# Patient Record
Sex: Male | Born: 1980 | Race: White | Hispanic: No | Smoking: Current every day smoker
Health system: Southern US, Community
[De-identification: ages and names within clinical notes are randomized; demographics above are authoritative.]

## PROBLEM LIST (undated history)

## (undated) DIAGNOSIS — F32A Depression, unspecified: Secondary | ICD-10-CM

## (undated) DIAGNOSIS — F329 Major depressive disorder, single episode, unspecified: Secondary | ICD-10-CM

## (undated) DIAGNOSIS — E785 Hyperlipidemia, unspecified: Secondary | ICD-10-CM

## (undated) DIAGNOSIS — G459 Transient cerebral ischemic attack, unspecified: Secondary | ICD-10-CM

## (undated) DIAGNOSIS — I10 Essential (primary) hypertension: Secondary | ICD-10-CM

## (undated) HISTORY — PX: APPENDECTOMY: SHX54

---

## 2010-02-14 ENCOUNTER — Inpatient Hospital Stay (HOSPITAL_COMMUNITY): Admission: EM | Admit: 2010-02-14 | Discharge: 2010-02-15 | Payer: Self-pay | Admitting: Emergency Medicine

## 2010-02-23 ENCOUNTER — Encounter
Admission: RE | Admit: 2010-02-23 | Discharge: 2010-04-13 | Payer: Self-pay | Source: Home / Self Care | Attending: Orthopedic Surgery | Admitting: Orthopedic Surgery

## 2010-03-04 ENCOUNTER — Ambulatory Visit (HOSPITAL_COMMUNITY): Admission: RE | Admit: 2010-03-04 | Discharge: 2010-03-04 | Payer: Self-pay | Admitting: Neurosurgery

## 2010-07-07 LAB — DIFFERENTIAL
Basophils Absolute: 0 10*3/uL (ref 0.0–0.1)
Eosinophils Absolute: 0 10*3/uL (ref 0.0–0.7)
Eosinophils Relative: 0 % (ref 0–5)
Monocytes Absolute: 1.5 10*3/uL — ABNORMAL HIGH (ref 0.1–1.0)

## 2010-07-07 LAB — RAPID URINE DRUG SCREEN, HOSP PERFORMED
Amphetamines: NOT DETECTED
Barbiturates: NOT DETECTED
Benzodiazepines: NOT DETECTED
Cocaine: NOT DETECTED

## 2010-07-07 LAB — BASIC METABOLIC PANEL
BUN: 6 mg/dL (ref 6–23)
BUN: 7 mg/dL (ref 6–23)
CO2: 25 mEq/L (ref 19–32)
CO2: 28 mEq/L (ref 19–32)
Chloride: 105 mEq/L (ref 96–112)
Chloride: 106 mEq/L (ref 96–112)
Creatinine, Ser: 0.96 mg/dL (ref 0.4–1.5)
Glucose, Bld: 90 mg/dL (ref 70–99)
Glucose, Bld: 93 mg/dL (ref 70–99)
Potassium: 4 mEq/L (ref 3.5–5.1)

## 2010-07-07 LAB — CBC
HCT: 39.8 % (ref 39.0–52.0)
MCH: 28 pg (ref 26.0–34.0)
MCH: 28.4 pg (ref 26.0–34.0)
MCHC: 32.9 g/dL (ref 30.0–36.0)
MCHC: 33 g/dL (ref 30.0–36.0)
MCV: 84.8 fL (ref 78.0–100.0)
MCV: 86.3 fL (ref 78.0–100.0)
Platelets: 229 10*3/uL (ref 150–400)
RDW: 13.8 % (ref 11.5–15.5)
RDW: 13.9 % (ref 11.5–15.5)

## 2010-07-07 LAB — ETHANOL: Alcohol, Ethyl (B): 128 mg/dL — ABNORMAL HIGH (ref 0–10)

## 2010-07-07 LAB — MRSA PCR SCREENING: MRSA by PCR: NEGATIVE

## 2010-07-07 LAB — TYPE AND SCREEN: ABO/RH(D): A POS
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
## 2011-09-06 ENCOUNTER — Encounter (HOSPITAL_COMMUNITY): Payer: Self-pay

## 2011-09-06 ENCOUNTER — Inpatient Hospital Stay (HOSPITAL_COMMUNITY)
Admission: AD | Admit: 2011-09-06 | Discharge: 2011-09-10 | DRG: 897 | Disposition: A | Discharge status: Home / Self Care | Payer: PRIVATE HEALTH INSURANCE | Source: Ambulatory Visit | Attending: Psychiatry | Admitting: Psychiatry

## 2011-09-06 ENCOUNTER — Emergency Department (HOSPITAL_COMMUNITY)
Admission: EM | Admit: 2011-09-06 | Discharge: 2011-09-06 | Disposition: A | Discharge status: Other Acute Inpatient Hospital | Payer: Self-pay | Attending: Emergency Medicine | Admitting: Emergency Medicine

## 2011-09-06 ENCOUNTER — Encounter (HOSPITAL_COMMUNITY): Payer: Self-pay | Admitting: Emergency Medicine

## 2011-09-06 DIAGNOSIS — F411 Generalized anxiety disorder: Secondary | ICD-10-CM | POA: Insufficient documentation

## 2011-09-06 DIAGNOSIS — F19939 Other psychoactive substance use, unspecified with withdrawal, unspecified: Secondary | ICD-10-CM | POA: Diagnosis present

## 2011-09-06 DIAGNOSIS — F1120 Opioid dependence, uncomplicated: Principal | ICD-10-CM | POA: Diagnosis present

## 2011-09-06 DIAGNOSIS — F1920 Other psychoactive substance dependence, uncomplicated: Secondary | ICD-10-CM

## 2011-09-06 DIAGNOSIS — F1994 Other psychoactive substance use, unspecified with psychoactive substance-induced mood disorder: Secondary | ICD-10-CM | POA: Diagnosis present

## 2011-09-06 DIAGNOSIS — F1910 Other psychoactive substance abuse, uncomplicated: Secondary | ICD-10-CM | POA: Insufficient documentation

## 2011-09-06 HISTORY — DX: Major depressive disorder, single episode, unspecified: F32.9

## 2011-09-06 HISTORY — DX: Depression, unspecified: F32.A

## 2011-09-06 LAB — CBC
MCH: 28.8 pg (ref 26.0–34.0)
MCHC: 33.7 g/dL (ref 30.0–36.0)
Platelets: 281 10*3/uL (ref 150–400)

## 2011-09-06 LAB — COMPREHENSIVE METABOLIC PANEL
ALT: 230 U/L — ABNORMAL HIGH (ref 0–53)
AST: 68 U/L — ABNORMAL HIGH (ref 0–37)
Calcium: 9.3 mg/dL (ref 8.4–10.5)
Creatinine, Ser: 1.24 mg/dL (ref 0.50–1.35)
GFR calc Af Amer: 89 mL/min — ABNORMAL LOW (ref >90)
Glucose, Bld: 82 mg/dL (ref 70–99)
Sodium: 134 mEq/L — ABNORMAL LOW (ref 135–145)
Total Protein: 7.3 g/dL (ref 6.0–8.3)

## 2011-09-06 LAB — RAPID URINE DRUG SCREEN, HOSP PERFORMED
Barbiturates: NOT DETECTED
Benzodiazepines: NOT DETECTED
Cocaine: NOT DETECTED
Opiates: POSITIVE — AB

## 2011-09-06 MED ORDER — CHLORDIAZEPOXIDE HCL 25 MG PO CAPS
25.0000 mg | ORAL_CAPSULE | Freq: Four times a day (QID) | ORAL | Status: AC | PRN
  Administered 2011-09-08: 25 mg via ORAL
  Filled 2011-09-06: qty 1

## 2011-09-06 MED ORDER — CLONIDINE HCL 0.1 MG PO TABS
0.1000 mg | ORAL_TABLET | Freq: Four times a day (QID) | ORAL | Status: AC
  Administered 2011-09-06 – 2011-09-08 (×10): 0.1 mg via ORAL
  Filled 2011-09-06 (×12): qty 1

## 2011-09-06 MED ORDER — ALUM & MAG HYDROXIDE-SIMETH 200-200-20 MG/5ML PO SUSP: 30.0000 mL | ORAL | Status: DC | PRN

## 2011-09-06 MED ORDER — MAGNESIUM HYDROXIDE 400 MG/5ML PO SUSP: 30.0000 mL | Freq: Every day | ORAL | Status: DC | PRN

## 2011-09-06 MED ORDER — ONDANSETRON HCL 4 MG PO TABS: 4.0000 mg | ORAL_TABLET | Freq: Three times a day (TID) | ORAL | Status: DC | PRN

## 2011-09-06 MED ORDER — ADULT MULTIVITAMIN W/MINERALS CH
1.0000 | ORAL_TABLET | Freq: Every day | ORAL | Status: DC
  Administered 2011-09-06 – 2011-09-10 (×5): 1 via ORAL
  Filled 2011-09-06 (×7): qty 1

## 2011-09-06 MED ORDER — DICYCLOMINE HCL 20 MG PO TABS
20.0000 mg | ORAL_TABLET | Freq: Four times a day (QID) | ORAL | Status: DC | PRN
  Administered 2011-09-06 – 2011-09-07 (×2): 20 mg via ORAL
  Filled 2011-09-06 (×2): qty 1

## 2011-09-06 MED ORDER — CHLORDIAZEPOXIDE HCL 25 MG PO CAPS: 25.0000 mg | ORAL_CAPSULE | Freq: Three times a day (TID) | ORAL | Status: DC

## 2011-09-06 MED ORDER — CHLORDIAZEPOXIDE HCL 25 MG PO CAPS: 25.0000 mg | ORAL_CAPSULE | ORAL | Status: DC

## 2011-09-06 MED ORDER — LOPERAMIDE HCL 2 MG PO CAPS
2.0000 mg | ORAL_CAPSULE | ORAL | Status: DC | PRN
  Administered 2011-09-07: 4 mg via ORAL

## 2011-09-06 MED ORDER — CLONIDINE HCL 0.1 MG PO TABS
0.1000 mg | ORAL_TABLET | Freq: Once | ORAL | Status: AC
  Administered 2011-09-06: 0.1 mg via ORAL
  Filled 2011-09-06: qty 1

## 2011-09-06 MED ORDER — IBUPROFEN 600 MG PO TABS
600.0000 mg | ORAL_TABLET | Freq: Three times a day (TID) | ORAL | Status: DC | PRN
  Administered 2011-09-06: 600 mg via ORAL
  Filled 2011-09-06: qty 1

## 2011-09-06 MED ORDER — HYDROXYZINE HCL 25 MG PO TABS
25.0000 mg | ORAL_TABLET | Freq: Four times a day (QID) | ORAL | Status: AC | PRN
  Administered 2011-09-07 – 2011-09-08 (×3): 25 mg via ORAL
  Filled 2011-09-06 (×3): qty 1

## 2011-09-06 MED ORDER — CLONIDINE HCL 0.1 MG PO TABS
0.1000 mg | ORAL_TABLET | Freq: Every day | ORAL | Status: DC
  Filled 2011-09-06 (×2): qty 1

## 2011-09-06 MED ORDER — ACETAMINOPHEN 325 MG PO TABS: 650.0000 mg | ORAL_TABLET | ORAL | Status: DC | PRN

## 2011-09-06 MED ORDER — METHOCARBAMOL 500 MG PO TABS
500.0000 mg | ORAL_TABLET | Freq: Three times a day (TID) | ORAL | Status: DC | PRN
  Administered 2011-09-06 – 2011-09-10 (×2): 500 mg via ORAL
  Filled 2011-09-06 (×2): qty 1

## 2011-09-06 MED ORDER — CHLORDIAZEPOXIDE HCL 25 MG PO CAPS
25.0000 mg | ORAL_CAPSULE | Freq: Four times a day (QID) | ORAL | Status: DC
  Administered 2011-09-06 – 2011-09-07 (×3): 25 mg via ORAL
  Filled 2011-09-06 (×3): qty 1

## 2011-09-06 MED ORDER — NICOTINE 21 MG/24HR TD PT24
21.0000 mg | MEDICATED_PATCH | Freq: Every day | TRANSDERMAL | Status: DC
  Administered 2011-09-06 – 2011-09-08 (×3): 21 mg via TRANSDERMAL
  Filled 2011-09-06 (×9): qty 1

## 2011-09-06 MED ORDER — ONDANSETRON 4 MG PO TBDP: 4.0000 mg | ORAL_TABLET | Freq: Four times a day (QID) | ORAL | Status: AC | PRN

## 2011-09-06 MED ORDER — THIAMINE HCL 100 MG/ML IJ SOLN: 100.0000 mg | Freq: Once | INTRAMUSCULAR | Status: DC

## 2011-09-06 MED ORDER — CHLORDIAZEPOXIDE HCL 25 MG PO CAPS: 25.0000 mg | ORAL_CAPSULE | Freq: Every day | ORAL | Status: DC

## 2011-09-06 MED ORDER — CLONIDINE HCL 0.1 MG PO TABS
0.1000 mg | ORAL_TABLET | ORAL | Status: AC
  Administered 2011-09-09 – 2011-09-10 (×4): 0.1 mg via ORAL
  Filled 2011-09-06 (×4): qty 1

## 2011-09-06 MED ORDER — NICOTINE 21 MG/24HR TD PT24
21.0000 mg | MEDICATED_PATCH | Freq: Once | TRANSDERMAL | Status: DC
  Administered 2011-09-06: 21 mg via TRANSDERMAL
  Filled 2011-09-06 (×2): qty 1

## 2011-09-06 MED ORDER — NICOTINE 21 MG/24HR TD PT24: 21.0000 mg | MEDICATED_PATCH | Freq: Every day | TRANSDERMAL | Status: DC

## 2011-09-06 MED ORDER — LORAZEPAM 1 MG PO TABS: 1.0000 mg | ORAL_TABLET | Freq: Three times a day (TID) | ORAL | Status: DC | PRN

## 2011-09-06 MED ORDER — TRAZODONE HCL 50 MG PO TABS
50.0000 mg | ORAL_TABLET | Freq: Every evening | ORAL | Status: DC | PRN
  Administered 2011-09-07 – 2011-09-09 (×3): 50 mg via ORAL
  Filled 2011-09-06 (×4): qty 1

## 2011-09-06 MED ORDER — NAPROXEN 500 MG PO TABS
500.0000 mg | ORAL_TABLET | Freq: Two times a day (BID) | ORAL | Status: DC | PRN
  Administered 2011-09-07 – 2011-09-09 (×5): 500 mg via ORAL
  Filled 2011-09-06 (×5): qty 1

## 2011-09-06 MED ORDER — VITAMIN B-1 100 MG PO TABS
100.0000 mg | ORAL_TABLET | Freq: Every day | ORAL | Status: DC
  Administered 2011-09-07 – 2011-09-10 (×4): 100 mg via ORAL
  Filled 2011-09-06 (×6): qty 1

## 2011-09-06 NOTE — BH Assessment (Signed)
Assessment Note   Maurice Edwards is a 31 y.o. male who presents to [facility] for detox.  Pt denies SI/HI/Psych.  Pt reports using: Heroin($100), Pain Pills(Percocet, Roxys- 5 pills), Alcohol(2 Beers), THC(5 Blunts), Cocaine(8 Ball, 3 Grams), all substances used daily.  Pt says--"I just got turned on to them, I want to stop for my 48 yr old son." "It's just not fun anymore".  Pt does have criminal charges--Shoplifting from [place], court date on 09/07/11.  Pt has no past hx of inpt SA treatment.  Pt c/o w/d sxs: body aches, leg/stomach cramps, tremors, skin crawling, HA, fatigue, sweats.  Pt attempted to obtain help from the methadone clinic but failed the test for benzos.    Axis I: Substance Abuse Axis II: Deferred Axis III:  Past Medical History  Diagnosis Date  . Depression    Axis IV: other psychosocial or environmental problems, problems related to legal system/crime, problems related to social environment and problems with primary support group Axis V: 51-60 moderate symptoms  Past Medical History:  Past Medical History  Diagnosis Date  . Depression     Past Surgical History  Procedure Date  . Appendectomy     Family History: No family history on file.  Social History:  reports that he has been smoking Cigarettes.  He has been smoking about 1 pack per day. He does not have any smokeless tobacco history on file. He reports that he drinks alcohol. He reports that he uses illicit drugs (IV, Cocaine, Marijuana, Opium, and Heroin).  Additional Social History:  Alcohol / Drug Use Pain Medications: None  Prescriptions: None  Over the Counter: None  History of alcohol / drug use?: Yes Longest period of sobriety (when/how long): None  Negative Consequences of Use: Financial;Personal relationships;Legal Withdrawal Symptoms: Cramps;Fever / Chills;Irritability;Sweats;Tremors;Tingling Substance #1 Name of Substance 1: Heroin  1 - Age of First Use: 29 YOM  1 - Amount (size/oz): $100 1 -  Frequency: Daily  1 - Duration: On-going  1 - Last Use / Amount: 09/05/11 Substance #2 Name of Substance 2: Pain Pills--Percocet, Roxys  2 - Age of First Use: 29 YOM  2 - Amount (size/oz): 5 Pills  2 - Frequency: Daily  2 - Duration: On-going  2 - Last Use / Amount: 09/05/11 Substance #3 Name of Substance 3: Alcohol  3 - Age of First Use: Teens  3 - Amount (size/oz): 2 Beers  3 - Frequency: Daily  3 - Duration: On-going  3 - Last Use / Amount: 09/05/11 Substance #4 Name of Substance 4: THC  4 - Age of First Use: Teens  4 - Amount (size/oz): 5 Blunts  4 - Frequency: Daily  4 - Duration: On-going  4 - Last Use / Amount: 09/05/11 Substance #5 Name of Substance 5: Cocaine  5 - Age of First Use: 29 YOM  5 - Amount (size/oz): "8 Ball", 3 Grams  5 - Frequency: Daily  5 - Duration: On-going  5 - Last Use / Amount: 09/05/11 Allergies:  Allergies  Allergen Reactions  . Penicillins     "childhood"    Home Medications:  (Not in a hospital admission)  OB/GYN Status:  No LMP for male patient.  General Assessment Data Location of Assessment: [facility] Living Arrangements: Other relatives Can pt return to current living arrangement?: Yes Admission Status: Voluntary Is patient capable of signing voluntary admission?: Yes Transfer from: Acute Hospital Referral Source: MD  Education Status Is patient currently in school?: No Current Grade: None  Highest grade of school patient has completed: Unk  Name of school: [employer]  Contact person: None   Risk to self Suicidal Ideation: No Suicidal Intent: No Is patient at risk for suicide?: No Suicidal Plan?: No Access to Means: No What has been your use of drugs/alcohol within the last 12 months?: Abusing: Heroin, Pain Pills, Alcohol, THC, Cocaine  Previous Attempts/Gestures: No How many times?: 0  Other Self Harm Risks: None  Triggers for Past Attempts: None known Intentional Self Injurious Behavior: None Family Suicide History:  No Recent stressful life event(s): Other (Comment) (Chronic SA) Persecutory voices/beliefs?: No Depression: Yes Depression Symptoms: Loss of interest in usual pleasures;Guilt Substance abuse history and/or treatment for substance abuse?: Yes Suicide prevention information given to non-admitted patients: Not applicable  Risk to Others Homicidal Ideation: No Thoughts of Harm to Others: No Current Homicidal Intent: No Current Homicidal Plan: No Access to Homicidal Means: No Identified Victim: None  History of harm to others?: No Assessment of Violence: None Noted Violent Behavior Description: None Does patient have access to weapons?: No Criminal Charges Pending?: Yes Describe Pending Criminal Charges: Shoplifting([employer])  Does patient have a court date: Yes Court Date: 09/07/11  Psychosis Hallucinations: None noted Delusions: None noted  Mental Status Report Appear/Hygiene: Disheveled;Poor hygiene Eye Contact: Fair Motor Activity: Unremarkable Speech: Logical/coherent Level of Consciousness: Alert Mood: Sad Affect: Sad Anxiety Level: None Thought Processes: Coherent;Relevant Judgement: Unimpaired Orientation: Person;Place;Time;Situation Obsessive Compulsive Thoughts/Behaviors: None  Cognitive Functioning Concentration: Normal Memory: Recent Intact;Remote Intact IQ: Average Insight: Fair Impulse Control: Fair Appetite: Fair Weight Loss: 0  Weight Gain: 0  Sleep: No Change Total Hours of Sleep: 6  Vegetative Symptoms: None  Prior Inpatient Therapy Prior Inpatient Therapy: No Prior Therapy Dates: None  Prior Therapy Facilty/Provider(s): None  Reason for Treatment: None   Prior Outpatient Therapy Prior Outpatient Therapy: No Prior Therapy Dates: None  Prior Therapy Facilty/Provider(s): None  Reason for Treatment: None   ADL Screening (condition at time of admission) Patient's cognitive ability adequate to safely complete daily activities?: Yes Patient  able to express need for assistance with ADLs?: Yes Independently performs ADLs?: Yes Weakness of Legs: None Weakness of Arms/Hands: None       Abuse/Neglect Assessment (Assessment to be complete while patient is alone) Physical Abuse: Denies Verbal Abuse: Denies Sexual Abuse: Denies Exploitation of patient/patient's resources: Denies Self-Neglect: Denies Values / Beliefs Cultural Requests During Hospitalization: None Spiritual Requests During Hospitalization: None Consults Spiritual Care Consult Needed: No Social Work Consult Needed: No Merchant navy officer (For Healthcare) Advance Directive: Patient does not have advance directive;Patient would not like information Pre-existing out of facility DNR order (yellow form or pink MOST form): No    Additional Information 1:1 In Past 12 Months?: No CIRT Risk: No Elopement Risk: No Does patient have medical clearance?: Yes     Disposition:  Disposition Disposition of Patient: Referred to Patient referred to: [facility]  On Site Evaluation by:   Reviewed with Physician:     Murrell Redden 09/06/2011 5:40 AM

## 2011-09-06 NOTE — H&P (Signed)
  Psychiatric Admission Assessment Adult  Patient Identification:  Maurice Edwards Date of Evaluation:  09/06/2011 31yo SWM CC: I need a detox opiates THC -Has court tomorrow 5/15 shoplifting from [place]  History of Present Illness: Says he wants to get clean for 5yo son. Says he just got turned on to drugs 2 years ago but now it's just not fun anymore. Has been using daily anything he can get. Heroin $100 Pain pills Beer THC and cocaine.Tried to go to a methadone clinic 2 weeks ago but was for benzoes and hence could not start. Tonight is already saying that he probably just needs to get on methadone.Hebuys his drugs using his unemployment check and selling to other users.   Past Psychiatric History: Denies   Substance Abuse History:  Social History:    reports that he has been smoking Cigarettes.  He has been smoking about 1 pack per day. He does not have any smokeless tobacco history on file. He reports that he drinks alcohol. He reports that he uses illicit drugs (IV, Cocaine, Marijuana, Opium, and Heroin). Started with alcohol and THC as a teen. Age 41 cocaine heroin & opiates HS class of 2000 never married has a 12 yo son denies probation but has had many prior charges.  Family Psych History: Mother abuses prescription pain drugs Father is an alcoholic   Past Medical History:     Past Medical History  Diagnosis Date  . Depression   Denies depression      Past Surgical History  Procedure Date  . Appendectomy   age 19 or 9   Allergies:  Allergies  Allergen Reactions  . Penicillins     "childhood"    Current Medications:  Prior to Admission medications   Not on File    Mental Status Examination/Evaluation: Objective:  Appearance: Fairly Groomed many large tattoos   Psychomotor Activity:  Normal  Eye Contact::  Good  Speech:  Normal Rate  Volume:  Normal  Mood: all right    Affect:  Appropriate  Thought Process: clear rational goal oriented  -go to methadone clinic    Orientation:  Full  Thought Content:  No AVH/psychosis   Suicidal Thoughts:  No  Homicidal Thoughts:  No  Judgement:  Fair  Insight:  Fair    DIAGNOSIS:    AXIS I Substance Abuse  AXIS II Deferred  AXIS III See medical history.  AXIS IV economic problems, educational problems, occupational problems, problems related to legal system/crime and problems with primary support group  AXIS V 51-60 moderate symptoms withdrawal      Treatment Plan Summary: Admit for safety & stabilization  Medically support through detox using Clonidine and Librium protocols. Help identify further SA treatment

## 2011-09-06 NOTE — Discharge Planning (Signed)
Patient has been accepted to [facility] by [facility] bed 305-2. Patient's support paperwork has been completed. EDP notified and is in agreement with disposition. EDP will discharge pt to [facility]. Pt nurse notified as well. ALL appropriate paperwork completed and forwarded to [facility] for review.   Manson Passey Zitlali Primm ANN S , MSW, LCSWA 09/06/2011 1:16 PM [phone]

## 2011-09-06 NOTE — ED Provider Notes (Signed)
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 The patient states that he uses daily, he uses by intravenous route and we cannot get heroin he will snort or ingest oral opiate medications. He states he has a 8-year-old child and wants to change so that he can give the child a future. Currently he feels like he is having some withdrawal symptoms with some abdominal cramping and nausea but has not had diaphoresis diarrhea or vomiting.  Physical exam:  Abdomen is soft, nontender, increased bowel sounds, skin is dry, mucous membranes are moist, pupils are 4 mm and reactive bilaterally, heart and lungs are clear without tachycardia murmurs wheezing rales or rhonchi. Peripheral extremities show no signs of edema, no signs of rash, no signs of subcutaneous infections as a sequelae of intravenous drug use.  Assessment:  Behavioral assessment team contacted to help with placement, patient appears hemodynamically stable, medications for opiate withdrawal ordered, he does not have any tremor, hallucinations, seizures or history of DTs and does not appear to be alcohol or benzodiazepine withdrawal at this time.  Medical screening examination/treatment/procedure(s) were conducted as a shared visit with non-physician practitioner(s) and myself.  I personally evaluated the patient during the encounter    Vida Roller, MD 09/06/11 [phone]

## 2011-09-06 NOTE — ED Notes (Signed)
Patient belongings now behind nurses station at room 25. Patients family at bedside. Mother taking patients wallet home.

## 2011-09-06 NOTE — ED Notes (Signed)
Belongings placed in [place] [address]

## 2011-09-06 NOTE — Progress Notes (Signed)
Patient came to the window for her 1700 medications. He asked if he had order for Methadone or Suboxon. His mood and affects appropriate and he denied SI/HI, denied Hallucinations, endorsed having chills and sweaty. Writer told patient that clonidine protocol is used on the unit for opiate withdrawals. Q 15 minute check continues to maintain safety.

## 2011-09-06 NOTE — ED Notes (Signed)
Pt. and belongings wanded by security 

## 2011-09-06 NOTE — ED Notes (Signed)
Mother has taken patients wallet home with patients approval. 2 bags of belongings remain behind nurses station at room 25 on acute side of ED. Mother would like for patient to call home when he gets discharged, transported or sent home.

## 2011-09-06 NOTE — Discharge Planning (Signed)
CSW contacted [facility]. Advised by Brett Canales there are no beds.  Called [facility] and Elnita Maxwell advised there are no beds either.  Patient to be run at [facility].  CSW spoke with patient about his court date tomorrow. Patient reports that his lawyer is aware of his pending admission for detox and will communicate that on his court date.  Manson Passey Latajah Thuman ANN S , MSW, LCSWA 09/06/2011 9:35 AM [phone]

## 2011-09-06 NOTE — ED Notes (Signed)
Pt alert,nad,arrives from home, wanting detox from drugs and alcohol , last used this evening, resp even unlabored, skin pwd

## 2011-09-06 NOTE — ED Notes (Addendum)
Pt belongings behind nurses station; bag one contains shoes, bag two contains pants, shirt, hat, cell phone, and wallet.

## 2011-09-06 NOTE — ED Notes (Signed)
Report received-airway intact-no s/s's of distress-resting quietly

## 2011-09-06 NOTE — ED Notes (Signed)
Pt sts he is trying to detox from heroin. In no pain at this time, sts he just feels sick.

## 2011-09-06 NOTE — ED Notes (Addendum)
Pt discharged to [facility]. Taken via IT trainer. Two bags belongings sent with pt. Report given to [facility] RN. [facility]SS.

## 2011-09-06 NOTE — Progress Notes (Signed)
Patient ID: Maurice Edwards, male   DOB: 1981/04/23, 32 y.o.   MRN: [id number] Pt denies SI/HI/AVH. Pt denies any history of physical, verbal, or sexual abuse. Pt denies any depression. Pt admitted voluntarily for drug and ETOH detox. Pt uses cocaine, heroin, THC, and opiates. Pt states that he also drinks 3-4 times a week. Pt consumes both beer and liquor. Pt states that he "drinks like a fish."  Pt began smoking THC at the age of 20. States that he use to take [facility] from his mother. Pt has a 25 year old son that is his motivation to detox. PT states that he wants to be a role model for him. Pt is currently homeless.

## 2011-09-06 NOTE — ED Notes (Signed)
Resting comfortably-no s/s's of withdrawal-no complaints at this time-will continue to monitor

## 2011-09-06 NOTE — ED Notes (Signed)
Pt admitted to psych ED requesting detox from opiates. States he's been injecting heroin daily for past year. Uses approx. $100 each day. States began using because pills became too expensive. States he has never had rehab before but wants to get clean for his 30 year old son and to have a life with him. States uses MJ daily and ETOH about three days/week. Denies SI/HI or A/VH. Pt is pleasant and cooperative. Unit policies reviewed. Verbalized understanding. Will cont. To monitor.

## 2011-09-06 NOTE — Discharge Planning (Signed)
[facility] called to followup on call placed by PM ACT team member advising they are awaiting information on patient.  CSW faxed referral form to [facility] and confirmed recpt.  Pending disposition.  Manson Passey Rowyn Spilde ANN S , MSW, LCSWA 09/06/2011 12:29 PM [phone]

## 2011-09-06 NOTE — Tx Team (Signed)
Initial Interdisciplinary Treatment Plan  PATIENT STRENGTHS: (choose at least two) Ability for insight Active sense of humor Average or above average intelligence Motivation for treatment/growth  PATIENT STRESSORS: Substance abuse   PROBLEM LIST: Problem List/Patient Goals Date to be addressed Date deferred Reason deferred Estimated date of resolution  Substance Abuse                                                       DISCHARGE CRITERIA:  Motivation to continue treatment in a less acute level of care  PRELIMINARY DISCHARGE PLAN: Outpatient therapy  PATIENT/FAMIILY INVOLVEMENT: This treatment plan has been presented to and reviewed with the patient, [name], and/or family member.  The patient and family have been given the opportunity to ask questions and make suggestions.  Gretta Arab [facility] 09/06/2011, 3:28 PM

## 2011-09-06 NOTE — ED Provider Notes (Signed)
History     CSN: [id number]  Arrival date & time 09/06/11  0056   First MD Initiated Contact with Patient 09/06/11 0211      Chief Complaint  Patient presents with  . Medical Clearance  . Detox     (Consider location/radiation/quality/duration/timing/severity/associated sxs/prior treatment) HPI Comments: Patient has been injecting heroin for the past year and a half days.  He started once detox from it.  He is also using pills.  It is buying off the street including benzos has tried going to the methadone clinic twice in the last 2 weeks, but has failed a drug test because he said benzos in his system.  Is not suicidal or homicidal  The history is provided by the patient.    History reviewed. No pertinent past medical history.  Past Surgical History  Procedure Date  . Appendectomy     No family history on file.  History  Substance Use Topics  . Smoking status: Current Everyday Smoker -- 1.0 packs/day    Types: Cigarettes  . Smokeless tobacco: Not on file  . Alcohol Use: Yes      Review of Systems  Constitutional: Negative for fever.  HENT: Negative for rhinorrhea.   Gastrointestinal: Negative for abdominal pain.  Musculoskeletal: Negative for myalgias.  Psychiatric/Behavioral: Negative for suicidal ideas. The patient is nervous/anxious.     Allergies  Penicillins  Home Medications  No current outpatient prescriptions on file.  BP 120/65  Pulse 68  Temp 98 F (36.7 C)  Resp 16  Wt 160 lb (72.576 kg)  SpO2 99%  Physical Exam  Constitutional: He is oriented to person, place, and time. He appears well-developed and well-nourished.  HENT:  Head: Normocephalic.  Eyes: Pupils are equal, round, and reactive to light.  Neck: Normal range of motion.  Cardiovascular: Normal rate.   Pulmonary/Chest: Effort normal.  Musculoskeletal: Normal range of motion.  Neurological: He is alert and oriented to person, place, and time.  Skin: Skin is warm.  Psychiatric:  His behavior is normal. His mood appears anxious. His speech is not rapid and/or pressured. Cognition and memory are normal. He does not express impulsivity. He expresses no homicidal and no suicidal ideation.    ED Course  Procedures (including critical care time)   Labs Reviewed  CBC  COMPREHENSIVE METABOLIC PANEL  ETHANOL  URINE RAPID DRUG SCREEN (HOSP PERFORMED)   No results found.   No diagnosis found.    MDM  Resting detox from heroin.  At this time is feeling, just anterior like he wants to get high.  Denies abdominal pain, nausea, tachycardia.  We'll treat with clonidine        Arman Filter, NP 09/06/11 0234

## 2011-09-07 DIAGNOSIS — F1994 Other psychoactive substance use, unspecified with psychoactive substance-induced mood disorder: Secondary | ICD-10-CM

## 2011-09-07 NOTE — Progress Notes (Signed)
Pt attended discharge planning group and actively participated.  Pt presents with calm mood and affect.  Pt was open with sharing reason for entering the hospital.  Pt states that he has been using heroin and pain pills for the past 2 years.  Pt states he brought himself here to get help and to detox.  Pt states that he hasn't received help to get off of drugs before.  Pt states that he is homeless, living back and forth between [facility] and [place].  Pt states that he wants to get clean for his 74 year old son.  Pt is open to long term treatment after d/c.  SW will assess for appropriate referrals.  Pt denies having depression, anxiety and SI.  No further needs voiced by pt at this time.    Reyes Ivan, LCSWA 09/07/2011  10:23 AM

## 2011-09-07 NOTE — Progress Notes (Signed)
Patient ID: Nicolas Banh, male   DOB: Mar 16, 1981, 31 y.o.   MRN: [id number] He has been up and to groups , interacting with peers and staff. Has c/o withdrawal  Symptoms of general discomfort all over.  Requested and received prn tod for pain in AM and anxiety in the PM.

## 2011-09-07 NOTE — [facility] Suicide Risk Assessment (Signed)
Suicide Risk Assessment  Admission Assessment      Demographic factors:  See chart.  Current Mental Status: Patient seen and evaluated. Chart reviewed. Patient stated that his mood was "not good".  Drug of choice: heroin.  His affect was mood congruent and irritable. He denied any current thoughts of self injurious behavior, suicidal ideation or homicidal ideation. He denied any significant depressive signs or symptoms at this time. There were no auditory or visual hallucinations, paranoia, delusional thought processes, or mania noted.  Thought process was linear and goal directed.  No psychomotor agitation or retardation was noted. His speech was normal rate, tone and volume. Eye contact was good. Judgment and insight are fair.  Patient has been up and engaged on the unit.  No acute safety concerns reported from team.  Loss Factors: Legal issues;Financial problems / change in socioeconomic status; laid off from [employer]  Historical Factors: Family history of suicide;Family history of mental illness or substance abuse; "father alcoholic and mother drug addict"  Risk Reduction Factors: Responsible for children under 10 years of age  CLINICAL FACTORS: Polysubstance Dependence (Opioid, Cannabis and Alcohol); Opioid Dependence & W/D; SIMD  COGNITIVE FEATURES THAT CONTRIBUTE TO RISK: limited insight.  SUICIDE RISK: Patient is currently viewed as a low risk of harm to himself and others in light of his history and risk factors. There are no acute safety concerns on the unit.   PLAN OF CARE: Pt admitted for crisis stabilization, detox and treatment.  Please see orders.   Medications reviewed with pt and medication education provided. Will continue q15 minute checks per unit protocol.  No clinical indication for one on one level of observation at this time.  Pt contracting for safety.  Continued sobriety will mitigate against the increased risk of harm to self and/or others.  Discussed the  importance of recovery with pt, as well as, tools to move forward in a healthy & safe manner.  Pt open to residential tx options. Pt agreeable with the plan.  Discussed with the team.   Will also do STD/Hep panel per pt request.  Lupe Carney 09/07/2011, 11:08 AM

## 2011-09-07 NOTE — H&P (Signed)
Pt seen and evaluated upon admission.  Completed Admission Suicide Risk Assessment.  See orders.  Pt agreeable with plan.  Discussed with team.   

## 2011-09-07 NOTE — Progress Notes (Signed)
Patient ID: Maurice Edwards, male   DOB: Dec 20, 1980, 32 y.o.   MRN: [id number] Pt pleasant and cooperative with staff; pt compliant with medications and is out in milieu. Pt c/o anxiety and requests sleep aid. PRN meds given as ordered. Pt denies SI/HI at this time. No s/s of distress noted.

## 2011-09-07 NOTE — Progress Notes (Signed)
Patient ID: Maurice Edwards, male   DOB: Feb 03, 1981, 31 y.o.   MRN: [id number] Medication effective; pt resting with no s/s of distress or anxiety noted.

## 2011-09-07 NOTE — Progress Notes (Signed)
Brief Nutrition Note  Reason: Nutrition Risk for unintentional weight loss > 10 lb over 1 month.  Patient reported his appetite has been ok. He reported to eat well when he is high. He stated his weight fluctuates between summer and winter. He reported on a typical day he eats eggs for breakfast with rice, then a cheeseburger at lunch. Then dinner. He reported he drinks mostly water. He expressed interest in healthy nutrition and sample menus.  We have discussed healthy foods. I encouraged patient to lead a healthy lifestyle with foods and exercise. He stated he plan to join a gym. I have provided patient with handouts obtained from ADA nutrition care manual for healthy nutrition and sample menus. He is without any nutrition related questions at this time.  RD available for nutrition needs.  Iven Finn [facility] [phone]

## 2011-09-08 LAB — HEPATITIS PANEL, ACUTE
HCV Ab: REACTIVE — AB
Hep A IgM: NEGATIVE
Hep B C IgM: NEGATIVE
Hepatitis B Surface Ag: NEGATIVE

## 2011-09-08 NOTE — Progress Notes (Signed)
Pt attended discharge planning group and actively participated.  Pt presents with flat affect and depressed mood.  Pt ranks depression and anxiety at a 5-6 today.  Pt denies SI.  Pt states that he feels "decent" today and a little better because he received pictures of his son.  Pt states that he is unsure of he wants to go to [facility] for further treatment or outpatient for the methadone clinic.  Pt states that he will continue to think about this.  No further needs voiced by pt at this time.    Reyes Ivan, LCSWA 09/08/2011  9:25 AM

## 2011-09-08 NOTE — Tx Team (Signed)
Interdisciplinary Treatment Plan Update (Adult)  Date:  09/08/2011  Time Reviewed:  10:03 AM   Progress in Treatment: Attending groups: Yes Participating in groups:  Yes Taking medication as prescribed: Yes Tolerating medication:  Yes Family/Significant othe contact made:  Counselor assessing for appropriate contact Patient understands diagnosis:  Yes Discussing patient identified problems/goals with staff:  Yes Medical problems stabilized or resolved:  Yes Denies suicidal/homicidal ideation: Yes Issues/concerns per patient self-inventory:  None identified Other: N/A  New problem(s) identified: None Identified  Reason for Continuation of Hospitalization: Anxiety Depression Medication stabilization Withdrawal symptoms  Interventions implemented related to continuation of hospitalization: mood stabilization, medication monitoring and adjustment, group therapy and psycho education, safety checks q 15 mins  Additional comments: N/A  Estimated length of stay: 3-5 days  Discharge Plan: SW assess for appropriate referrals.    New goal(s): N/A  Review of initial/current patient goals per problem list:    1.  Goal(s): Address substance use  Met:  No  Target date: by discharge  As evidenced by: completing detox protocol and refer to appropriate treatment  2.  Goal (s): Reduce depressive symptoms  Met:  No  Target date: by discharge  As evidenced by: Reducing depression from a 10 to a 3 as reported by pt.    3.  Goal(s): Reduce anxiety symptoms  Met:  No  Target date: by discharge  As evidenced by: Reducing anxiety from a 10 to a 3 as reported by pt.     Attendees: Patient:  Maurice Edwards 09/08/2011 10:04 AM   Family:     Physician:  Lupe Carney, DO 09/08/2011 10:03 AM   Nursing: Roswell Miners, RN 09/08/2011 10:03 AM   Case Manager:  Reyes Ivan, LCSWA 09/08/2011  10:03 AM   Counselor:  Ronda Fairly, LCSWA 09/08/2011  10:03 AM   Other:  Richelle Ito, LCSW  09/08/2011 10:03 AM   Other:     Other:     Other:      Scribe for Treatment Team:   Reyes Ivan 09/08/2011 10:03 AM

## 2011-09-08 NOTE — Progress Notes (Signed)
Patient ID: Maurice Edwards, male   DOB: [birth year], 31 y.o.   MRN: [id number] Pt denies SI/HI/AVH, pt wrote on his self inventory poor sleep, appetite improving, energy level normal, ability to pay attention is good, 5/10 depression and hopelessness scale, COWS high ealier-given Librium PRN with relief, wants to attend some NA classes after discharge.

## 2011-09-09 NOTE — Progress Notes (Signed)
[facility] Group Notes:  (Counselor/Nursing/MHT/Case Management/Adjunct)  09/09/2011 3:12 PM  Type of Therapy:  Psychoeducational Skills  Participation Level:  Minimal  Participation Quality:  Appropriate and Resistant  Affect:  Appropriate and Flat  Cognitive:  Appropriate  Insight:  Limited  Engagement in Group:  Limited  Engagement in Therapy:  Limited  Modes of Intervention:  Education  Summary of Progress/Problems:Pt attended group discussing Relapse Prevention Plan. Patients participation was limited only spoke in group several times.    Dalia Heading 09/09/2011, 3:12 PM

## 2011-09-09 NOTE — Progress Notes (Signed)
Patient ID: Maurice Edwards, male   DOB: 17-Jul-1980, 31 y.o.   MRN: [id number]   Patient lying in bed. Has been awake on and off since shift change. Moved some when went in room but eyes closed. Trying to sleep at present. Staff will continue to monitor.

## 2011-09-09 NOTE — Progress Notes (Signed)
Patient ID: Maurice Edwards, male   DOB: 12/24/1980, 31 y.o.   MRN: [id number] Pt denies SI/HI/AVH.  He wrote on his self inventory that he slept well, appetite is good, energy level high, ability to pay attention good, 1/10 on depression and hopelessness scale.  Plans to enroll in an outpatient program after discharge.

## 2011-09-09 NOTE — Progress Notes (Signed)
Pt. Pleasant and cooperative.  Denies SI/HI and denies A/V hallucinations.  Denies withdrawal symptoms at present. Encouragement and support given.  Pt. Receptive.

## 2011-09-09 NOTE — Progress Notes (Signed)
[facility] Group Notes:  (Counselor/Nursing/MHT/Case Management/Adjunct)  09/09/2011 2:43 PM  Type of Therapy:  Group Therapy at 11:00  Participation Level:  Did Not Attend  [name] 09/09/2011, 2:43 PM   [facility] Group Notes:  (Counselor/Nursing/MHT/Case Management/Adjunct)  09/09/2011 2:44 PM  Type of Therapy:  Group Therapy at 1:15  Participation Level:  Minimal  Participation Quality:  Inattentive  Affect:  Irritable  Cognitive:  Oriented  Insight:  None shared  Engagement in Group:  None  Engagement in Therapy:  None  Modes of Intervention:  Education and Support  Summary of Progress/Problems:  Maurice Edwards attended group and choose not to share.    [name] 09/09/2011, 2:46 PM

## 2011-09-09 NOTE — Progress Notes (Signed)
Pt attended discharge planning group and actively participated.  Pt presents with calm mood and affect.  Pt denies having depression, anxiety and SI today.  Pt reports feeling stable to d/c today.  Pt states that he doesn't want to go to inpatient treatment at this point and would prefer to do outpatient and the methadone clinic.  SW referred pt to [facility] for methadone clinic.  SW also provided pt with NA meetings in [place]. Pt states that he plans to live with his grandma in [place] upon d/c.  Pt reports having transportation.  No further needs voiced by pt at this time.  Safety planning and suicide prevention discussed.     Maurice Edwards, LCSWA 09/09/2011  10:21 AM

## 2011-09-09 NOTE — Tx Team (Signed)
Interdisciplinary Treatment Plan Update (Adult)  Date:  09/09/2011  Time Reviewed:  11:02 AM   Progress in Treatment: Attending groups: Yes Participating in groups:  Yes Taking medication as prescribed: Yes Tolerating medication:  Yes Family/Significant othe contact made: No   Patient understands diagnosis:  Yes Discussing patient identified problems/goals with staff:  Yes Medical problems stabilized or resolved:  Yes Denies suicidal/homicidal ideation: Yes Issues/concerns per patient self-inventory:  None identified Other: N/A  New problem(s) identified: None Identified  Reason for Continuation of Hospitalization: Withdrawal symptoms  Interventions implemented related to continuation of hospitalization: mood stabilization, medication monitoring and adjustment, group therapy and psycho education, safety checks q 15 mins  Additional comments: N/A  Estimated length of stay: 1 day  Discharge Plan: Pt denies further inpatient treatment and would prefer referral to [facility] for methadone clinic.  SW made this referral.   New goal(s): N/A  Review of initial/current patient goals per problem list:    1.  Goal(s): Address substance use  Met:  No  Target date: by discharge  As evidenced by: completing detox protocol and refer to appropriate treatment  2.  Goal (s): Reduce depressive and anxiety symptoms  Met:  Yes  Target date: by discharge  As evidenced by: Reducing depression from a 10 to a 3 as reported by pt.  Pt denies depression and anxiety.   3.  Goal(s): Eliminate SI  Met:  Yes  Target date: by discharge  As evidenced by: Pt denies SI.     Attendees: Patient:     Family:     Physician:  Lupe Carney, DO 09/09/2011 11:02 AM   Nursing: Nanine Means, RN 09/09/2011 11:02 AM   Case Manager:  Reyes Ivan, LCSWA 09/09/2011  11:02 AM   Counselor:  Ronda Fairly, LCSWA 09/09/2011  11:02 AM   Other:  Richelle Ito, LCSW 09/09/2011 11:02 AM   Other:      Other:     Other:      Scribe for Treatment Team:   Reyes Ivan 09/09/2011 11:02 AM

## 2011-09-09 NOTE — Progress Notes (Signed)
Patient ID: Maurice Edwards, male   DOB: 03/24/1981, 31 y.o.   MRN: [id number]  Maurice Edwards presents a fully alert, pleasant, and cooperative and denied withdrawal symptoms. He is thinking about going forword to [facility] and is considering their outpatient treatment program and possibly their methadone clinic. He is motivated to stop his substance use he out of love for his 89-year-old son and desire to be a good father.  Mental status exam: Fully alert, in full contact with reality. Pleasant, cooperative, with neutral mood and appropriate affect. He is pleased that he has gotten through the detox uneventfully and is physically feeling very good. Thoughts and speech are normally organized he is looking forward to outpatient treatment.   Plan:  Discharge tomorrow.

## 2011-09-09 NOTE — Progress Notes (Signed)
[facility] Group Notes:  (Counselor/Nursing/MHT/Case Management/Adjunct)  09/09/2011 2:39 PM  Type of Therapy:  Group Therapy at 11 and 1:15 on 09/08/11  Participation Level:  Minimal  Participation Quality:  Drowsy  Affect:  Irritable  Cognitive:  Oriented  Insight:  None shared  Engagement in Group:  Limited  Engagement in Therapy:  Unknown  Modes of Intervention:  Clarification, Orientation, Socialization and Support  Summary of Progress/Problems:  Patient attended both groups yet choose not to participate in group activity in which patients choose photographs to represent what their life would look and feel like were it in balance and another for out of balance.   Maurice Edwards was inattentive and drowsy   Maurice Edwards 09/09/2011, 2:43 PM

## 2011-09-09 NOTE — Progress Notes (Signed)
S/O: Pt seen and evaluated in treatment team.  Reviewed short term and long term goals, medications, current treatment in the hospital and acute/chronic safety.    Patient stated that his mood was "half and half". Drug of choice: heroin.  Started cannabis at age 31. His affect was mood congruent and irritable. He denied any current thoughts of self injurious behavior, suicidal ideation or homicidal ideation. He denied any significant depressive signs or symptoms at this time. There were no auditory or visual hallucinations, paranoia, delusional thought processes, or mania noted. Thought process was linear and goal directed. No psychomotor agitation or retardation was noted. His speech was normal rate, tone and volume. Eye contact was good. Judgment and insight are fair. Patient has been up and engaged on the unit. No acute safety concerns reported from team.   A/P: Polysubstance Dependence (Opioid, Cannabis and Alcohol); Opioid Dependence & W/D; SIMD  Pt interested in [place].  Will continue current meds and treatment plan:  Meds-    . cloNIDine  0.1 mg Oral QID   Followed by  . cloNIDine  0.1 mg Oral BH-qamhs   Followed by  . cloNIDine  0.1 mg Oral QAC breakfast  . mulitivitamin with minerals  1 tablet Oral Daily  . nicotine  21 mg Transdermal Daily  . thiamine  100 mg Intramuscular Once  . thiamine  100 mg Oral Daily      Medication education completed.  Pros, cons, risks, potential side effects and benefits (including no treatment) were discussed with pt.  Pt agreeable with the plan.  See orders.  Discussed with team.
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
## 2011-09-09 NOTE — Progress Notes (Signed)
[facility] Adult Inpatient Family/Significant Collateral Information  Olander Friedl, Josh's grandmother, has been identified by the patient as the family member/significant counselor could contact for collateral information. Mrs Pawloski concerns consist of the following:  1. She is not in favor of Methadone program patient is planning to enter 2. Nor is she in favor of Josh returning to home he has been living in as she believes others in the home continue to use narcotics.   [name] 09/09/2011, 2:41 PM

## 2011-09-10 DIAGNOSIS — F1120 Opioid dependence, uncomplicated: Principal | ICD-10-CM

## 2011-09-10 NOTE — [facility] Suicide Risk Assessment (Signed)
Suicide Risk Assessment  Discharge Assessment     Demographic factors:  Male;Caucasian;Low socioeconomic status;Unemployed    Current Mental Status Per Nursing Assessment::   On Admission:   denied At Discharge:   denies  Current Mental Status Per Physician: Fully alert, in full contact with reality. Pleasant, cooperative, with neutral mood and appropriate affect. He is pleased that he has gotten through the detox uneventfully and is physically feeling very good. Thoughts and speech are normally organized he is looking forward to outpatient care.     Loss Factors: Legal issues;Financial problems / change in socioeconomic status, homeless  Historical Factors: Family history of suicide;Family history of mental illness or substance abuse  Risk Reduction Factors:    wants to get better, making future plans  Continued Clinical Symptoms:  Alcohol/Substance Abuse/Dependencies  Discharge Diagnoses:   AXIS I:  Polysubstance Dependence (Opioid, Cannabis and Alcohol)  AXIS II:  Deferred AXIS III:   Past Medical History  Diagnosis Date  . Depression    AXIS IV:  housing problems AXIS V:  51-60 moderate symptoms  Cognitive Features That Contribute To Risk:  Closed-mindedness    Suicide Risk:  Minimal: No identifiable suicidal ideation.  Patients presenting with no risk factors but with morbid ruminations; may be classified as minimal risk based on the severity of the depressive symptoms  Plan Of Care/Follow-up recommendations:   Follow up as planned. 90 meetings in 90 days with NA/AA. If you do not have a sponsor yet please get one as soon as possible.   Maurice Edwards 09/10/2011, 11:04 AM

## 2011-09-10 NOTE — Progress Notes (Signed)
Patient ID: Maurice Edwards, male   DOB: 07-11-1980, 31 y.o.   MRN: [id number]  [facility] Group Notes:  (Counselor/Nursing/MHT/Case Management/Adjunct)  09/10/2011 1:15 PM  Type of Therapy:  Group Therapy, Dance/Movement Therapy   Participation Level:  Did Not Attend   [facility]

## 2011-09-10 NOTE — Progress Notes (Signed)
Patient ID: Maurice Edwards, male   DOB: Oct 24, 1980, 31 y.o.   MRN: [id number] Pt. attended and participated in aftercare planning group. Pt. accepted information on suicide prevention, warning signs to look for with suicide and crisis line numbers to use. The pt. agreed to call crisis line numbers if having warning signs or having thoughts of suicide. Pt. listed their current anxiety level as low and depression as low.    Pt. indicated that he wanted a listing of [employer] locations. Pt was given listing of state wide homes to call.

## 2011-09-10 NOTE — Progress Notes (Signed)
Patient ID: Maurice Edwards, male   DOB: 1980-05-23, 31 y.o.   MRN: [id number] 09-10-11 discharge discharge: pt denied any si/hi/av. He stated he understood his d/c instructions. He stated he understood his f/u instructions. He didn't get any scripts or sample medications. He got his belongings. He was escorted to the lobby. His aunt will transport him.

## 2011-09-10 NOTE — Discharge Summary (Signed)
Physician Discharge Summary Note  Patient:  Maurice Edwards is an 31 y.o., male MRN:  [id number] DOB:  13-Jan-1981 Patient phone:  [phone] (home)  Patient address:   [address] [place] [address],   Date of Admission:  09/06/2011 Date of Discharge: 09/10/2011 Reason for Admission:  Discharge Diagnoses: Principal Problem:  *Polysubstance (including opioids) dependence, daily use   Axis Diagnosis:   AXIS I:  Opiate dependence, Alcohol abuse, cocaine abuse AXIS II:  Deferred AXIS III:   Past Medical History  Diagnosis Date  . Depression    AXIS IV:  problems with primary support group AXIS V:  51-60 moderate symptoms  Level of Care:  OP  Hospital Course:  Josh was admitted for detox from alcohol, cocaine, opiates, and crisis management.  He/she was treated with the standard Clonodin protocol.  Medical problems were identified and treated.  Home medication was restarted as appropriate.     Improvement was monitored by CIWA/COWS scores and patient's daily report of withdrawal symptom reduction. Emotional and mental status was monitored by daily self inventory reports completed by the patient and clinical staff.      The patient was evaluated by the treatment team for stability and plans for continued recovery upon discharge. He was offered further treatment options upon discharge including Residential, IOP, and Outpatient treatment.  The patient's motivation was an integral factor for scheduling further treatment.  Employment, transportation, bed availability, health status, family support, and any pending legal issues were also considered.    Upon completion of detox the patient was both mentally and medically stable for discharge.    Consults:  None  Significant Diagnostic Studies:  None  Discharge Vitals:   Blood pressure 121/79, pulse 80, temperature 96.9 F (36.1 C), temperature source Oral, resp. rate 16, height 5\' 5"  (1.651 m), weight 68.947 kg (152 lb).  Mental Status  Exam: See Mental Status Examination and Suicide Risk Assessment completed by Attending Physician prior to discharge.  Discharge destination:  Home  Is patient on multiple antipsychotic therapies at discharge:  No   Has Patient had three or more failed trials of antipsychotic monotherapy by history:  No  Recommended Plan for Multiple Antipsychotic Therapies: Not applicable  Discharge Orders    Future Orders Please Complete By Expires   Diet - low sodium heart healthy      Increase activity slowly      Discharge instructions      Comments:   Follow up as planned. 90 meetings in  90 days with NA/AA.  If you do not have a sponsor yet please get one as soon as possible.     Medication List    Notice       You have not been prescribed any medications.            Follow-up Information    Follow up with [facility] on 09/12/2011. (walk in Monday 5:30-9:30 AM to start services)    Contact information:   [address] [place], [place] [address] [phone]      Follow up with NA meetings. (See prinout of local meetings)          Follow-up recommendations:  Activity:  as tolerated Diet:  heart healthy  Comments:    Signed: Lloyd Huger T. Carlye Panameno PAC For Dr. Lupe Carney 09/10/2011, 10:06 AM

## 2011-09-11 NOTE — Progress Notes (Signed)
Therapist discovered pt's note with dates of stay that pt requested at D/C yesterday. Pt had forgotten to take this with him. Therapist placed the note in an sealed envelope with the pt's name on it up front at the receptionist desk for the pt to pick up.

## 2011-09-11 NOTE — Progress Notes (Signed)
[facility] Case Management Discharge Plan:  Will you be returning to the same living situation after discharge: Yes,   At discharge, do you have transportation home?:Yes,   Do you have the ability to pay for your medications:Yes,    Interagency Information:     Release of information consent forms completed and in the chart;  Patient's signature needed at discharge.  Patient to Follow up at:  Follow-up Information    Follow up with [facility] on 09/12/2011. (walk in Monday 5:30-9:30 AM to start services)    Contact information:   [address] [place], [place] [address] [phone]      Follow up with NA meetings. (See prinout of local meetings)          Patient denies SI/HI:   Yes,      Safety Planning and Suicide Prevention discussed:  Yes,    Barrier to discharge identified:No.  Summary and Recommendations:Pt was cleared for D/C and denies any and all S/I and H/I.     [facility] 09/11/2011, 11:52 AM

## 2011-09-13 NOTE — Progress Notes (Signed)
Patient Discharge Instructions:  After Visit Summary (AVS):   Faxed to:  09/12/2011 Psychiatric Admission Assessment Note:   Faxed to:  09/12/2011 Suicide Risk Assessment - Discharge Assessment:   Faxed to:  09/12/2011 Faxed/Sent to the Next Level Care provider:  09/12/2011  Faxed to [facility] @ [phone]  Wandra Scot, 09/13/2011, 10:54 AM

## 2019-06-20 ENCOUNTER — Emergency Department: Payer: BC Managed Care – PPO

## 2019-06-20 ENCOUNTER — Emergency Department
Admission: EM | Admit: 2019-06-20 | Discharge: 2019-06-20 | Disposition: A | Discharge status: Home / Self Care | Payer: BC Managed Care – PPO | Attending: Emergency Medicine | Admitting: Emergency Medicine

## 2019-06-20 DIAGNOSIS — F17210 Nicotine dependence, cigarettes, uncomplicated: Secondary | ICD-10-CM | POA: Insufficient documentation

## 2019-06-20 DIAGNOSIS — I10 Essential (primary) hypertension: Secondary | ICD-10-CM | POA: Insufficient documentation

## 2019-06-20 DIAGNOSIS — E785 Hyperlipidemia, unspecified: Secondary | ICD-10-CM | POA: Insufficient documentation

## 2019-06-20 DIAGNOSIS — Z8673 Personal history of transient ischemic attack (TIA), and cerebral infarction without residual deficits: Secondary | ICD-10-CM | POA: Insufficient documentation

## 2019-06-20 DIAGNOSIS — R079 Chest pain, unspecified: Secondary | ICD-10-CM | POA: Insufficient documentation

## 2019-06-20 HISTORY — DX: Transient cerebral ischemic attack, unspecified: G45.9

## 2019-06-20 HISTORY — DX: Essential (primary) hypertension: I10

## 2019-06-20 HISTORY — DX: Hyperlipidemia, unspecified: E78.5

## 2019-06-20 LAB — CBC AND DIFFERENTIAL
Absolute NRBC: 0 10*3/uL (ref 0.00–0.00)
Basophils Absolute Automated: 0.07 10*3/uL (ref 0.00–0.08)
Basophils Automated: 0.8 %
Eosinophils Absolute Automated: 0.23 10*3/uL (ref 0.00–0.44)
Eosinophils Automated: 2.8 %
Hematocrit: 44.4 % (ref 37.6–49.6)
Hgb: 15.5 g/dL (ref 12.5–17.1)
Immature Granulocytes Absolute: 0.03 10*3/uL (ref 0.00–0.07)
Immature Granulocytes: 0.4 %
Lymphocytes Absolute Automated: 2 10*3/uL (ref 0.42–3.22)
Lymphocytes Automated: 24.3 %
MCH: 31.4 pg (ref 25.1–33.5)
MCHC: 34.9 g/dL (ref 31.5–35.8)
MCV: 89.9 fL (ref 78.0–96.0)
MPV: 9.5 fL (ref 8.9–12.5)
Monocytes Absolute Automated: 0.77 10*3/uL (ref 0.21–0.85)
Monocytes: 9.3 %
Neutrophils Absolute: 5.14 10*3/uL (ref 1.10–6.33)
Neutrophils: 62.4 %
Nucleated RBC: 0 /100 WBC (ref 0.0–0.0)
Platelets: 276 10*3/uL (ref 142–346)
RBC: 4.94 10*6/uL (ref 4.20–5.90)
RDW: 13 % (ref 11–15)
WBC: 8.24 10*3/uL (ref 3.10–9.50)

## 2019-06-20 LAB — COMPREHENSIVE METABOLIC PANEL
ALT: 28 U/L (ref 0–55)
AST (SGOT): 21 U/L (ref 5–34)
Albumin/Globulin Ratio: 1.6 (ref 0.9–2.2)
Albumin: 4.4 g/dL (ref 3.5–5.0)
Alkaline Phosphatase: 92 U/L (ref 38–106)
Anion Gap: 15 (ref 5.0–15.0)
BUN: 16 mg/dL (ref 9.0–28.0)
Bilirubin, Total: 0.6 mg/dL (ref 0.2–1.2)
CO2: 25 mEq/L (ref 22–29)
Calcium: 9.4 mg/dL (ref 8.5–10.5)
Chloride: 99 mEq/L — ABNORMAL LOW (ref 100–111)
Creatinine: 0.9 mg/dL (ref 0.7–1.3)
Globulin: 2.8 g/dL (ref 2.0–3.6)
Glucose: 102 mg/dL — ABNORMAL HIGH (ref 70–100)
Potassium: 4.1 mEq/L (ref 3.5–5.1)
Protein, Total: 7.2 g/dL (ref 6.0–8.3)
Sodium: 139 mEq/L (ref 136–145)

## 2019-06-20 LAB — ECG 12-LEAD
Atrial Rate: 65 {beats}/min
P Axis: 43 degrees
P-R Interval: 170 ms
Q-T Interval: 422 ms
QRS Duration: 114 ms
QTC Calculation (Bezet): 438 ms
R Axis: 50 degrees
T Axis: 38 degrees
Ventricular Rate: 65 {beats}/min

## 2019-06-20 LAB — TROPONIN I: Troponin I: <0.01 ng/mL (ref 0.00–0.05)

## 2019-06-20 LAB — GFR: EGFR: >60

## 2019-06-20 LAB — MAGNESIUM: Magnesium: 2 mg/dL (ref 1.6–2.6)

## 2019-06-20 LAB — IHS D-DIMER: D-Dimer: <0.27 ug/mL FEU (ref 0.00–0.50)

## 2019-06-20 MED ORDER — PROPRANOLOL HCL 10 MG PO TABS
10.00 mg | ORAL_TABLET | Freq: Every day | ORAL | 0 refills | Status: AC | PRN
Start: 2019-06-20 — End: ?

## 2019-06-20 NOTE — Discharge Instructions (Signed)
Chest Pain of Unclear Etiology     You have been seen for chest pain. The cause of your pain is not yet known.     Your doctor has learned about your medical history, examined you, and checked any tests that were done. Still, it is not clear why you are having pain. The doctor thinks there is only a small chance that your pain is caused by a health problem that could lead to serious harm or death. Later, your primary care doctor might do more tests or check you again.     Sometimes chest pain is caused by a health problem that can lead to death, like a:  · Heart attack.  · Injury to the large blood vessel in your body (aorta).  · Blood clot in the lung.  · Collapsed lung.      It is not likely that your pain is caused by a health problem that could lead to death if:   · Your chest pain lasts only a few seconds at a time  · You are not short of breath, nauseated (sick to your stomach), sweaty, or lightheaded  · Your pain gets worse when you twist or bend  · Your pain improves with exercise or hard work.     Chest pain is serious. It is very important that you follow up with your regular doctor.     Return here or go to the nearest Emergency Department immediately if:  · Your pain makes you short of breath, nauseated (sick to your stomach), or sweaty.  · Your pain gets worse when you walk, go up stairs, or exert yourself.  · You feel weak, lightheaded, or faint.  · It hurts to breathe.  · Your leg swells.  · Your pain or symptoms get worse   · You have new symptoms or concerns.     If you can't follow up with your doctor, or if at any time you feel you need to be rechecked or seen again, come back here or go to the nearest emergency department.

## 2019-06-20 NOTE — ED Provider Notes (Addendum)
[facility] EMERGENCY DEPARTMENT H&P         CLINICAL INFORMATION        HPI:      Chief Complaint: Chest Pain  .    Lee Mcfarland is a 39 y.o. male who presents with moderate resolved substernal chest pressure.  Onset while driving.  No radiation.  Duration 20 minutes.  Resolved without meds.  Patient describes as a panic attack with a hot feeling in his head his chest and his groin.  No cough no sputum no fever  Was diagnosed with a mini stroke in November with left arm tingling.  Started on aspirin at that time.  Patient took aspirin today.  Patient saw cardiology in November with normal echo and stress test.  Patient did have left leg vein stripping last month.  Had follow-up sonogram 2 weeks ago with no clot.  No leg pain or edema  No trauma  Does not want Covid testing.  Has no symptoms.  Cardiac risk factors +family history +smoking -cholesterol -diabetes +hypertension    History obtained from: patient, family, review of prior chart          ROS:      Positive and negative ROS elements as per HPI.  All other systems reviewed and negative.      Physical Exam:      Pulse 77  BP 147/71  Resp 20  SpO2 98 %  Temp 98.2 F (36.8 C)    Physical Exam   Constitutional: He is oriented to person, place, and time. He appears well-developed and well-nourished.   HENT:   Head: Normocephalic and atraumatic.   Mouth/Throat: Oropharynx is clear and moist.   Eyes: Pupils are equal, round, and reactive to light. Conjunctivae and EOM are normal.   Neck: Normal range of motion. Neck supple.   Cardiovascular: Normal rate, regular rhythm and normal heart sounds.   Pulmonary/Chest: Effort normal and breath sounds normal.   Abdominal: Soft. Bowel sounds are normal. There is no abdominal tenderness. There is no guarding.   Musculoskeletal: Normal range of motion.         General: No edema (no palpable cords).   Neurological: He is alert and oriented to person, place, and time. He has normal  reflexes.   Skin: Skin is warm and dry.   Psychiatric: He has a normal mood and affect. Thought content normal.                PAST HISTORY        Primary Care Provider: Claudie Leach, MD        PMH/PSH:    .     Past Medical History:   Diagnosis Date   . Hyperlipidemia    . Hypertension    . TIA (transient ischemic attack)        He has no past surgical history on file.      Social/Family History:      He reports that he has been smoking cigarettes. He has been smoking about 1.00 pack per day. He has never used smokeless tobacco. He reports that he does not drink alcohol or use drugs.    History reviewed. No pertinent family history.      Listed Medications on Arrival:    .     Home Medications             hydroCHLOROthiazide (HYDRODIURIL) 12.5 MG tablet     Take 12.5 mg by mouth daily  Allergies: He has No Known Allergies.            VISIT INFORMATION        Clinical Course in the ED:            Medications Given in the ED:    .     ED Medication Orders (From admission, onward)    None            Procedures:      Procedures      Interpretations:      EKG -             interpreted by me: normal sinus at 72 no ischemic changes normal intervals normal EKG                 RESULTS        Lab Results:      Results     Procedure Component Value Units Date/Time    D-Dimer [[id number]] Collected: 06/20/19 0804     Updated: 06/20/19 0916     D-Dimer <0.27 ug/mL FEU     Troponin I [[id number]] Collected: 06/20/19 0804    Specimen: Blood Updated: 06/20/19 0853     Troponin I <0.01 ng/mL     GFR [[id number]] Collected: 06/20/19 0804     Updated: 06/20/19 0848     EGFR >60.0    Comprehensive metabolic panel [[id number]]  (Abnormal) Collected: 06/20/19 0804    Specimen: Blood Updated: 06/20/19 0848     Glucose 102 mg/dL      BUN 93.2 mg/dL      Creatinine 0.9 mg/dL      Sodium 355 mEq/L      Potassium 4.1 mEq/L      Chloride 99 mEq/L      CO2 25 mEq/L      Calcium 9.4 mg/dL      Protein, Total 7.2 g/dL      Albumin 4.4  g/dL      AST (SGOT) 21 U/L      ALT 28 U/L      Alkaline Phosphatase 92 U/L      Bilirubin, Total 0.6 mg/dL      Globulin 2.8 g/dL      Albumin/Globulin Ratio 1.6     Anion Gap 15.0    Magnesium [[id number]] Collected: 06/20/19 0804    Specimen: Blood Updated: 06/20/19 0848     Magnesium 2.0 mg/dL     CBC and differential [[id number]] Collected: 06/20/19 0804    Specimen: Blood Updated: 06/20/19 0838     WBC 8.24 x10 3/uL      Hgb 15.5 g/dL      Hematocrit 31.5 %      Platelets 276 x10 3/uL      RBC 4.94 x10 6/uL      MCV 89.9 fL      MCH 31.4 pg      MCHC 34.9 g/dL      RDW 13 %      MPV 9.5 fL      Neutrophils 62.4 %      Lymphocytes Automated 24.3 %      Monocytes 9.3 %      Eosinophils Automated 2.8 %      Basophils Automated 0.8 %      Immature Granulocytes 0.4 %      Nucleated RBC 0.0 /100 WBC      Neutrophils Absolute 5.14 x10 3/uL      Lymphocytes Absolute Automated 2.00 x10 3/uL  Monocytes Absolute Automated 0.77 x10 3/uL      Eosinophils Absolute Automated 0.23 x10 3/uL      Basophils Absolute Automated 0.07 x10 3/uL      Immature Granulocytes Absolute 0.03 x10 3/uL      Absolute NRBC 0.00 x10 3/uL               Radiology Results:      XR Chest 2 Views   Final Result         No acute cardiopulmonary process.      Linna [place], MD    06/20/2019 8:27 AM                 Visit date: 06/20/2019      CLINICAL SUMMARY          Diagnosis:    .     Final diagnoses:   Chest pain of uncertain etiology         MDM Notes:      I am the first provider for this patient.  I reviewed the vital signs, nursing notes, past medical history, past surgical history, family history and social history.  I have reviewed the patient's previous charts.    39 year old atypical chest pain x20 minutes.  Initial evaluation negative.  Patient recently saw cardiology.  Will give trial of Inderal 10 mg tablets.  Refer back to his cardiologist.         Disposition:         Discharge               Discharge Prescriptions     Medication Sig  Dispense Auth. Provider    propranolol (INDERAL) 10 MG tablet Take 1 tablet (10 mg total) by mouth daily as needed (chest pain palpitations) 20 tablet Sutton Plake, Sheria Lang, MD                     Scribe Attestation:      No scribe involved in the care of this patient          Samuel Bouche, MD  06/20/19 8657       Samuel Bouche, MD  06/23/19 [phone]

## 2019-06-20 NOTE — ED Notes (Signed)
TO XR

## 2019-06-20 NOTE — ED Notes (Signed)
Patient returned from imaging, reconnected to monitors, resting comfortably, NAD

## 2019-06-20 NOTE — ED Notes (Signed)
Discharge instructions, prescriptions and follow-up care provided to and reviewed with patient. Patient denies any further questions or concerns at this time. GCS 15. A&Ox4. Patient ambulatory to exit with a steady gait.

## 2019-06-20 NOTE — ED Triage Notes (Signed)
Patient arrives via triage for concerns of anxiety/panic attack. Patient states he woke up and started having chest tightness, burning in his hands, head and groin and hyperventilating. He states he felt like this for approx 20 min then drove himself into the ED. On arrival he states that all symptoms have resolved and he feels "normal." Patient states he was diagnosed w/ a "mini-stroke" last November and started on new medicines for hypertension and hyperlipidemia but is unsure the names. GCS 15, A&Ox4, NAD.

## 2020-06-07 ENCOUNTER — Emergency Department (HOSPITAL_COMMUNITY)
Admission: EM | Admit: 2020-06-07 | Discharge: 2020-06-08 | Disposition: A | Discharge status: Home / Self Care | Payer: Self-pay | Attending: Emergency Medicine | Admitting: Emergency Medicine

## 2020-06-07 ENCOUNTER — Encounter (HOSPITAL_COMMUNITY): Payer: Self-pay | Admitting: Emergency Medicine

## 2020-06-07 DIAGNOSIS — F1920 Other psychoactive substance dependence, uncomplicated: Secondary | ICD-10-CM | POA: Diagnosis present

## 2020-06-07 DIAGNOSIS — F1910 Other psychoactive substance abuse, uncomplicated: Secondary | ICD-10-CM

## 2020-06-07 DIAGNOSIS — F22 Delusional disorders: Secondary | ICD-10-CM | POA: Insufficient documentation

## 2020-06-07 DIAGNOSIS — F17210 Nicotine dependence, cigarettes, uncomplicated: Secondary | ICD-10-CM | POA: Insufficient documentation

## 2020-06-07 DIAGNOSIS — Z046 Encounter for general psychiatric examination, requested by authority: Secondary | ICD-10-CM | POA: Insufficient documentation

## 2020-06-07 DIAGNOSIS — Z20822 Contact with and (suspected) exposure to covid-19: Secondary | ICD-10-CM | POA: Insufficient documentation

## 2020-06-07 DIAGNOSIS — F4324 Adjustment disorder with disturbance of conduct: Secondary | ICD-10-CM | POA: Insufficient documentation

## 2020-06-07 DIAGNOSIS — F19950 Other psychoactive substance use, unspecified with psychoactive substance-induced psychotic disorder with delusions: Secondary | ICD-10-CM | POA: Diagnosis present

## 2020-06-07 DIAGNOSIS — R45851 Suicidal ideations: Secondary | ICD-10-CM | POA: Insufficient documentation

## 2020-06-07 DIAGNOSIS — F19922 Other psychoactive substance use, unspecified with intoxication with perceptual disturbance: Secondary | ICD-10-CM | POA: Diagnosis present

## 2020-06-07 LAB — ACETAMINOPHEN LEVEL: Acetaminophen (Tylenol), Serum: <10 ug/mL — ABNORMAL LOW (ref 10–30)

## 2020-06-07 LAB — SALICYLATE LEVEL: Salicylate Lvl: <7 mg/dL — ABNORMAL LOW (ref 7.0–30.0)

## 2020-06-07 LAB — CBC WITH DIFFERENTIAL/PLATELET
Abs Immature Granulocytes: 0.02 10*3/uL (ref 0.00–0.07)
Basophils Absolute: 0 10*3/uL (ref 0.0–0.1)
Basophils Relative: 1 %
Eosinophils Absolute: 0 10*3/uL (ref 0.0–0.5)
Eosinophils Relative: 1 %
HCT: 49.7 % (ref 39.0–52.0)
Hemoglobin: 16.1 g/dL (ref 13.0–17.0)
Immature Granulocytes: 0 %
Lymphocytes Relative: 38 %
Lymphs Abs: 2.7 10*3/uL (ref 0.7–4.0)
MCH: 27.7 pg (ref 26.0–34.0)
MCHC: 32.4 g/dL (ref 30.0–36.0)
MCV: 85.4 fL (ref 80.0–100.0)
Monocytes Absolute: 0.4 10*3/uL (ref 0.1–1.0)
Monocytes Relative: 6 %
Neutro Abs: 3.9 10*3/uL (ref 1.7–7.7)
Neutrophils Relative %: 54 %
Platelets: 231 10*3/uL (ref 150–400)
RBC: 5.82 MIL/uL — ABNORMAL HIGH (ref 4.22–5.81)
RDW: 14.2 % (ref 11.5–15.5)
WBC: 7.2 10*3/uL (ref 4.0–10.5)
nRBC: 0 % (ref 0.0–0.2)

## 2020-06-07 LAB — COMPREHENSIVE METABOLIC PANEL
ALT: 197 U/L — ABNORMAL HIGH (ref 0–44)
AST: 127 U/L — ABNORMAL HIGH (ref 15–41)
Albumin: 4.6 g/dL (ref 3.5–5.0)
Alkaline Phosphatase: 84 U/L (ref 38–126)
Anion gap: 11 (ref 5–15)
BUN: 12 mg/dL (ref 6–20)
CO2: 25 mmol/L (ref 22–32)
Calcium: 10 mg/dL (ref 8.9–10.3)
Chloride: 105 mmol/L (ref 98–111)
Creatinine, Ser: 1.03 mg/dL (ref 0.61–1.24)
GFR, Estimated: >60 mL/min (ref >60)
Glucose, Bld: 135 mg/dL — ABNORMAL HIGH (ref 70–99)
Potassium: 3.5 mmol/L (ref 3.5–5.1)
Sodium: 141 mmol/L (ref 135–145)
Total Bilirubin: 0.9 mg/dL (ref 0.3–1.2)
Total Protein: 8.6 g/dL — ABNORMAL HIGH (ref 6.5–8.1)

## 2020-06-07 LAB — RESP PANEL BY RT-PCR (FLU A&B, COVID) ARPGX2
Influenza A by PCR: NEGATIVE
Influenza B by PCR: NEGATIVE
SARS Coronavirus 2 by RT PCR: NEGATIVE

## 2020-06-07 LAB — ETHANOL: Alcohol, Ethyl (B): <10 mg/dL (ref <10)

## 2020-06-07 MED ORDER — HYDROXYZINE HCL 25 MG PO TABS
25.0000 mg | ORAL_TABLET | Freq: Three times a day (TID) | ORAL | Status: DC | PRN
  Filled 2020-06-07: qty 1

## 2020-06-07 MED ORDER — OLANZAPINE 5 MG PO TABS: 5.0000 mg | ORAL_TABLET | Freq: Every day | ORAL | Status: DC

## 2020-06-07 NOTE — ED Triage Notes (Addendum)
Patient BIB [facility] with IVC paperwork. Says he wants to die, states people are trying to kill him.

## 2020-06-07 NOTE — ED Notes (Signed)
Patient offered hydroxyzine for increasing anxiety. Patient declined.

## 2020-06-07 NOTE — ED Notes (Addendum)
Patient changed into paper scrubs. Belongings secured at nurse's station in triage. Wallet, cash, Ashley ID, debit card, and credit card secured in patient valuables envelope with security. Key taped on envelope copy in orange folder with IVC paperwork.

## 2020-06-07 NOTE — ED Notes (Signed)
TTS machine set up at bedside   

## 2020-06-07 NOTE — BH Assessment (Signed)
Per Ophelia Shoulder, NP, patient will be observed and monitored overnight for safety and stability and medication management while UDS is pending and patient will be re-evaluated in the morning

## 2020-06-07 NOTE — BH Assessment (Signed)
Per Ophelia Shoulder, NP,  patient was recommended for overnight observation at the [facility]. 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 Once the UDS results return and meds are in place he will need to the transferred to the [facility] this evening. [facility] provider and [facility] nursing all made aware.

## 2020-06-07 NOTE — ED Provider Notes (Signed)
[facility]-EMERGENCY DEPT Provider Note   CSN: [id number] Arrival date & time: 06/07/20  1130     History Chief Complaint  Patient presents with  . IVC    Maurice Edwards is a 40 y.o. male.  40 year old male here under IVC due to suicidal ideations.  Possible drug intoxication as well 2.  According to IVC note, states that his wife is having sex with demons and that she is a reptile creature.  Stated that he wanted to die by overdose of fentanyl.  He denies all of this and has been cooperative for flat enforcement.        Past Medical History:  Diagnosis Date  . Depression     Patient Active Problem List   Diagnosis Date Noted  . Polysubstance (including opioids) dependence, daily use (HCC) 09/06/2011    Past Surgical History:  Procedure Laterality Date  . APPENDECTOMY         No family history on file.  Social History   Tobacco Use  . Smoking status: Current Every Day Smoker    Packs/day: 1.00    Types: Cigarettes  Substance Use Topics  . Alcohol use: Yes    Comment: 3-4 times weekly beer and liquor  . Drug use: Yes    Types: IV, Cocaine, Marijuana, Opium, Heroin    Home Medications Prior to Admission medications   Not on File    Allergies    Penicillins  Review of Systems   Review of Systems  All other systems reviewed and are negative.   Physical Exam Updated Vital Signs BP (!) 145/108   Pulse (!) 114   Temp 99.1 F (37.3 C) (Oral)   Resp 20   SpO2 99%   Physical Exam Vitals and nursing note reviewed.  Constitutional:      General: He is not in acute distress.    Appearance: Normal appearance. He is well-developed and well-nourished. He is not toxic-appearing.  HENT:     Head: Normocephalic and atraumatic.  Eyes:     General: Lids are normal.     Extraocular Movements: EOM normal.     Conjunctiva/sclera: Conjunctivae normal.     Pupils: Pupils are equal, round, and reactive to light.  Neck:     Thyroid: No  thyroid mass.     Trachea: No tracheal deviation.  Cardiovascular:     Rate and Rhythm: Normal rate and regular rhythm.     Heart sounds: Normal heart sounds. No murmur heard. No gallop.   Pulmonary:     Effort: Pulmonary effort is normal. No respiratory distress.     Breath sounds: Normal breath sounds. No stridor. No decreased breath sounds, wheezing, rhonchi or rales.  Abdominal:     General: Bowel sounds are normal. There is no distension.     Palpations: Abdomen is soft.     Tenderness: There is no abdominal tenderness. There is no CVA tenderness or rebound.  Musculoskeletal:        General: No tenderness or edema. Normal range of motion.     Cervical back: Normal range of motion and neck supple.  Skin:    General: Skin is warm and dry.     Findings: No abrasion or rash.  Neurological:     General: No focal deficit present.     Mental Status: He is alert and oriented to person, place, and time.     GCS: GCS eye subscore is 4. GCS verbal subscore is 5. GCS motor subscore  is 6.     Cranial Nerves: No cranial nerve deficit.     Sensory: No sensory deficit.     Deep Tendon Reflexes: Strength normal.  Psychiatric:        Attention and Perception: He is inattentive.        Mood and Affect: Affect is angry.        Speech: Speech normal.        Behavior: Behavior is aggressive.        Thought Content: Thought content includes suicidal ideation. Thought content includes suicidal plan.     ED Results / Procedures / Treatments   Labs (all labs ordered are listed, but only abnormal results are displayed) Labs Reviewed  RESP PANEL BY RT-PCR (FLU A&B, COVID) ARPGX2  ETHANOL  RAPID URINE DRUG SCREEN, HOSP PERFORMED  SALICYLATE LEVEL  ACETAMINOPHEN LEVEL  CBC WITH DIFFERENTIAL/PLATELET  COMPREHENSIVE METABOLIC PANEL    EKG None  Radiology No results found.  Procedures Procedures   Medications Ordered in ED Medications - No data to display  ED Course  I have reviewed  the triage vital signs and the nursing notes.  Pertinent labs & imaging results that were available during my care of the patient were reviewed by me and considered in my medical decision making (see chart for details).    MDM Rules/Calculators/A&P                          Patient is to be medically clear for psychiatric disposition Final Clinical Impression(s) / ED Diagnoses Final diagnoses:  None    Rx / DC Orders ED Discharge Orders    None       Lorre Nick, MD 06/07/20 1200

## 2020-06-07 NOTE — ED Notes (Signed)
Three labeled patient belongings bags and IVC paperwork transferred with patient.

## 2020-06-07 NOTE — ED Notes (Signed)
Charted on wrong patient. This patient has not voided yet
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
## 2020-06-07 NOTE — ED Notes (Signed)
Patient states he was unable to urinate to obtain urine specimen

## 2020-06-07 NOTE — ED Notes (Signed)
Handoff report given to Shannon RN

## 2020-06-08 ENCOUNTER — Encounter (HOSPITAL_COMMUNITY): Payer: Self-pay | Admitting: Registered Nurse

## 2020-06-08 DIAGNOSIS — F19950 Other psychoactive substance use, unspecified with psychoactive substance-induced psychotic disorder with delusions: Secondary | ICD-10-CM

## 2020-06-08 DIAGNOSIS — F1120 Opioid dependence, uncomplicated: Secondary | ICD-10-CM

## 2020-06-08 DIAGNOSIS — F1920 Other psychoactive substance dependence, uncomplicated: Secondary | ICD-10-CM

## 2020-06-08 DIAGNOSIS — F19922 Other psychoactive substance use, unspecified with intoxication with perceptual disturbance: Secondary | ICD-10-CM

## 2020-06-08 LAB — RAPID URINE DRUG SCREEN, HOSP PERFORMED
Amphetamines: POSITIVE — AB
Barbiturates: NOT DETECTED
Benzodiazepines: NOT DETECTED
Cocaine: NOT DETECTED
Opiates: NOT DETECTED
Tetrahydrocannabinol: POSITIVE — AB

## 2020-06-08 NOTE — Progress Notes (Signed)
06/08/2020  Called [facility] [phone] To see if patient can be transport now since UA has been completed this morning. Was told that they are full and they were told that we were [facility].

## 2020-06-08 NOTE — ED Provider Notes (Addendum)
Emergency Medicine Observation Re-evaluation Note  Maurice Edwards is a 40 y.o. male, seen on rounds today.  Pt initially presented to the ED for complaints of IVC, Suicidal, and Delusional Currently, the patient is resting.  Physical Exam  BP 126/88 (BP Location: Right Arm)   Pulse 84   Temp 98 F (36.7 C) (Oral)   Resp 19   SpO2 100%  Physical Exam General: in bed, answers to name Skin: dry Lungs: no respiratory distress, unlabored breathing Psych: currently calm and resting  ED Course / MDM  EKG:    I have reviewed the labs performed to date as well as medications administered while in observation.  Recent changes in the last 24 hours include patient awaiting psych re evaluation.  Plan  Current plan is for patient to be reevaluated by psych this morning.  Psych has re evaluated the patient, he has been psychiatrically cleared for discharge, resources have been given to the patient.  Patient will be discharged and treated as an outpatient.  Discharge plan and strict return to ED precautions discussed, patient verbalizes understanding and agreement.    Maurice Logan, DO 06/08/20 [id number]    Maurice Logan, DO 06/08/20 1227
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
## 2020-06-08 NOTE — ED Notes (Addendum)
Pt request to be moved back out into the hallway bed, states "I cannot stay in that room." Pt given sandwich and water at this time per request.

## 2020-06-08 NOTE — Discharge Instructions (Addendum)
For your behavioral health needs, you are advised to follow up with [facility]:      [facility]      [address]      [place], [place] [address]      [phone]      They offer psychiatry/medication management, therapy and substance abuse treatment.  New patients are being seen in their walk-in clinic.  Walk-in hours are Monday - Thursday from 8:00 am - 11:00 am for psychiatry, and Friday from 1:00 pm - 4:00 pm for therapy.  Walk-in patients are seen on a first come, first served basis, so try to arrive as early as possible for the best chance of being seen the same day.  
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
## 2020-06-08 NOTE — BH Assessment (Incomplete)
[facility] Assessment Progress Note  Per Shuvon Rankin, NP, this pt does not require psychiatric hospitalization at this time.  Pt presents under IVC initiated by pt's spouse and upheld by EDP Lorre Nick, MD, which has been rescinded by EDP Coralee Pesa, DO.  Pt is psychiatrically cleared.  Discharge instructions include referral information for [facility]  Pt would also benefit from seeing a Peer Support Specialist, and a peer support consult has been ordered for pt.  Dr Wilkie Aye and pt's nurse, Addison Naegeli, have been notified.  Doylene Canning, MA Triage Specialist [phone]

## 2020-06-08 NOTE — ED Notes (Signed)
Pt given container to empty colostomy bag per request.
